# Patient Record
Sex: Male | Born: 1985 | Race: Black or African American | Hispanic: No | Marital: Single
Health system: Southern US, Community
[De-identification: ages and names within clinical notes are randomized; demographics above are authoritative.]

---

## 2010-11-20 ENCOUNTER — Emergency Department (HOSPITAL_COMMUNITY)
Admission: EM | Admit: 2010-11-20 | Discharge: 2010-11-20 | Disposition: A | Discharge status: Home / Self Care | Payer: Self-pay | Attending: Emergency Medicine | Admitting: Emergency Medicine

## 2010-11-20 ENCOUNTER — Emergency Department (HOSPITAL_COMMUNITY): Payer: Self-pay

## 2010-11-20 DIAGNOSIS — R079 Chest pain, unspecified: Secondary | ICD-10-CM | POA: Insufficient documentation

## 2010-11-20 DIAGNOSIS — R071 Chest pain on breathing: Secondary | ICD-10-CM | POA: Insufficient documentation

## 2010-11-20 DIAGNOSIS — R059 Cough, unspecified: Secondary | ICD-10-CM | POA: Insufficient documentation

## 2010-11-20 DIAGNOSIS — R05 Cough: Secondary | ICD-10-CM | POA: Insufficient documentation

## 2010-11-20 DIAGNOSIS — R0602 Shortness of breath: Secondary | ICD-10-CM | POA: Insufficient documentation

## 2012-03-22 IMAGING — CR DG RIBS W/ CHEST 3+V*R*
3 series · 3 of 3 positions shown · non-contrast
Comparison: None.

CLINICAL DATA: Right lateral chest/rib pain with increased
shortness of breath

RIGHT RIBS AND CHEST - 3+ VIEW

[w chest pa]
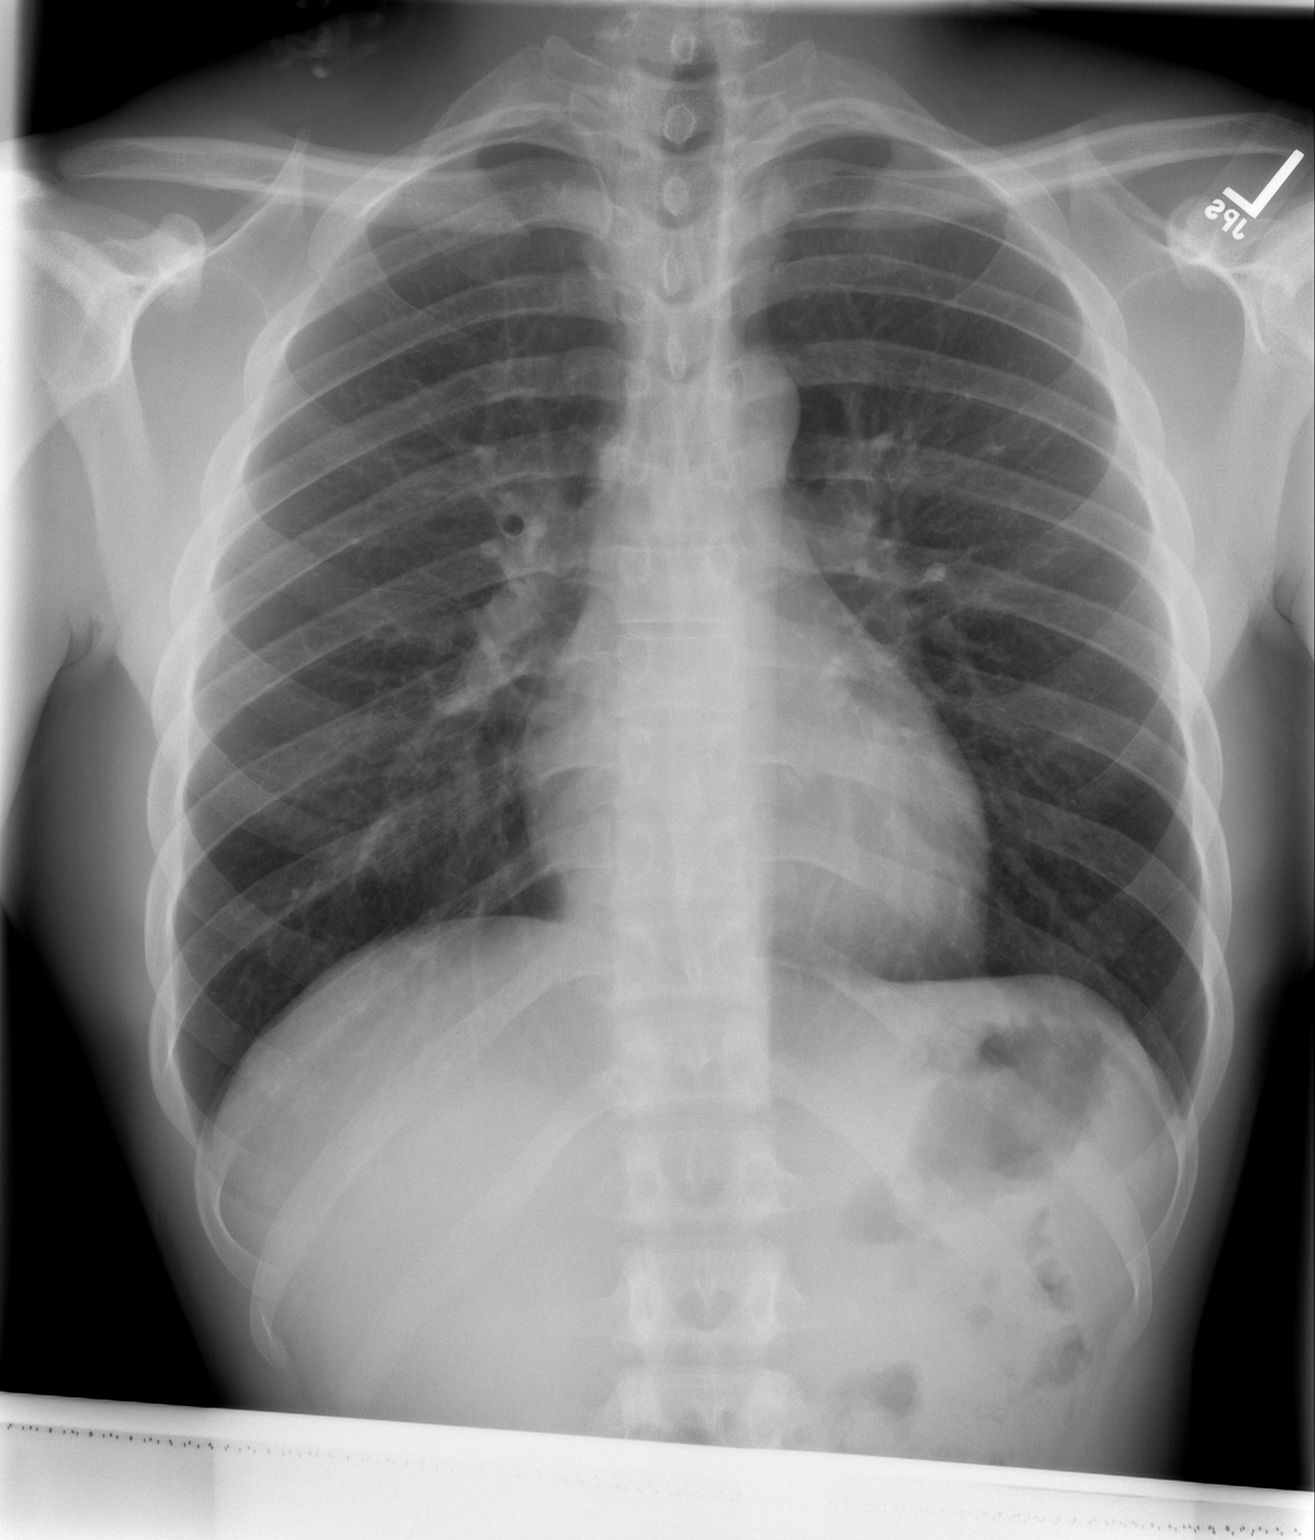

[w ribs ap/pa upper right]
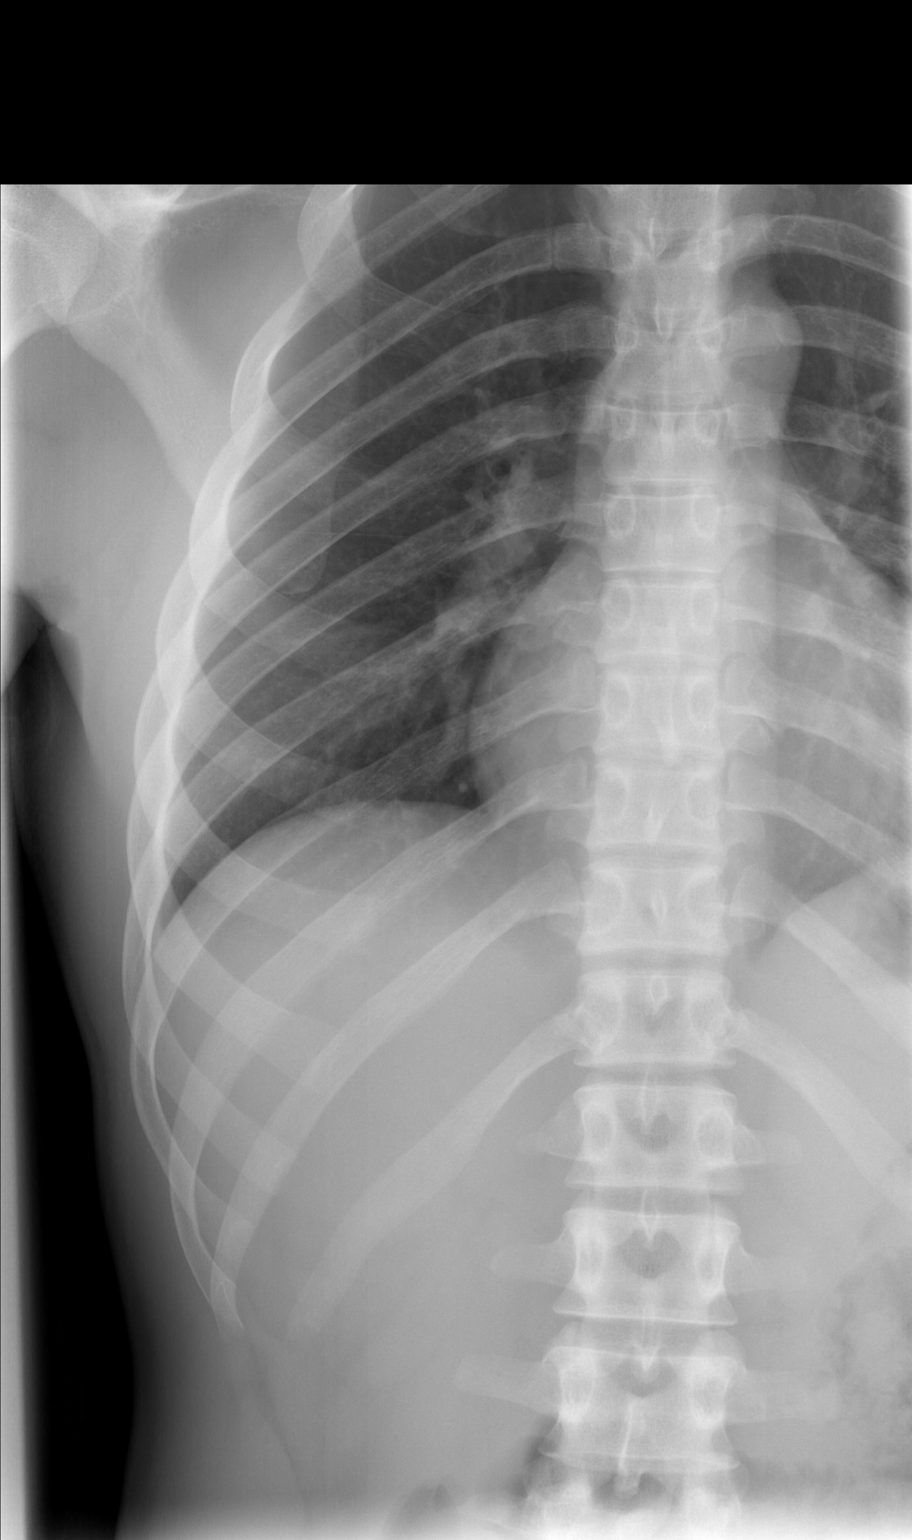

[w ribs ap/pa lower right *]
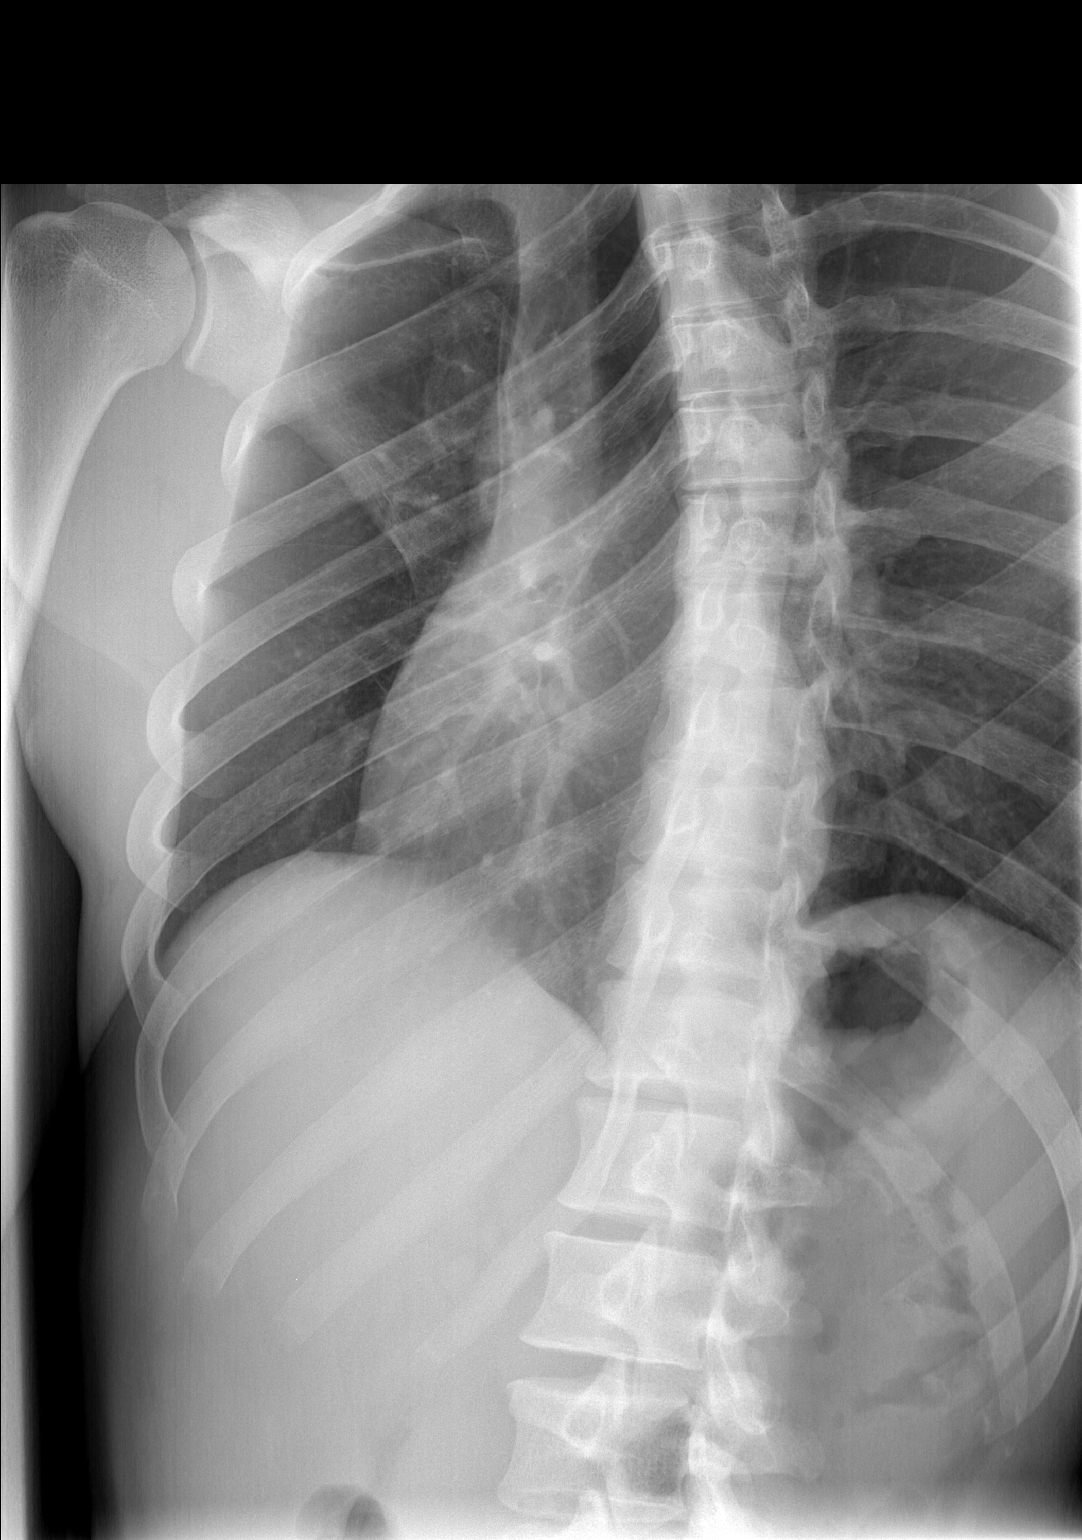

[3 of 3 positions shown; findings below may reference images not displayed]

FINDINGS: Normal cardiac silhouette and mediastinal contours.  No
focal parenchymal opacities.  No pleural effusion or pneumothorax.
No acute aggressive osseous abnormalities, specifically, no
displaced rib fractures.
IMPRESSION: 1.  No acute cardiopulmonary disease.
2.  No acute or aggressive osseous abnormalities, specifically, no
displaced rib fractures.

## 2018-04-23 ENCOUNTER — Emergency Department (HOSPITAL_COMMUNITY)
Admission: EM | Admit: 2018-04-23 | Discharge: 2018-04-23 | Disposition: A | Discharge status: Home / Self Care | Payer: Self-pay | Attending: Emergency Medicine | Admitting: Emergency Medicine

## 2018-04-23 DIAGNOSIS — Z5321 Procedure and treatment not carried out due to patient leaving prior to being seen by health care provider: Secondary | ICD-10-CM | POA: Insufficient documentation

## 2018-04-23 DIAGNOSIS — R079 Chest pain, unspecified: Secondary | ICD-10-CM | POA: Insufficient documentation

## 2018-04-23 DIAGNOSIS — M79601 Pain in right arm: Secondary | ICD-10-CM | POA: Insufficient documentation
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
